# Patient Record
Sex: Female | Born: 1970 | Race: Black or African American | Hispanic: No | Marital: Single | Smoking: Current every day smoker
Health system: Southern US, Community
[De-identification: ages and names within clinical notes are randomized; demographics above are authoritative.]

## PROBLEM LIST (undated history)

## (undated) DIAGNOSIS — I10 Essential (primary) hypertension: Secondary | ICD-10-CM

## (undated) HISTORY — PX: CHOLECYSTECTOMY: SHX55

---

## 1997-07-08 ENCOUNTER — Emergency Department (HOSPITAL_COMMUNITY): Admission: EM | Admit: 1997-07-08 | Discharge: 1997-07-08 | Payer: Self-pay | Admitting: Emergency Medicine

## 1999-02-09 ENCOUNTER — Encounter: Admission: RE | Admit: 1999-02-09 | Discharge: 1999-02-09 | Payer: Self-pay | Admitting: Specialist

## 1999-02-09 ENCOUNTER — Encounter: Payer: Self-pay | Admitting: Specialist

## 1999-02-14 ENCOUNTER — Ambulatory Visit (HOSPITAL_BASED_OUTPATIENT_CLINIC_OR_DEPARTMENT_OTHER): Admission: RE | Admit: 1999-02-14 | Discharge: 1999-02-14 | Payer: Self-pay | Admitting: Specialist

## 2002-01-01 ENCOUNTER — Emergency Department (HOSPITAL_COMMUNITY): Admission: EM | Admit: 2002-01-01 | Discharge: 2002-01-01 | Payer: Self-pay | Admitting: Emergency Medicine

## 2003-12-06 ENCOUNTER — Emergency Department (HOSPITAL_COMMUNITY): Admission: EM | Admit: 2003-12-06 | Discharge: 2003-12-06 | Payer: Self-pay | Admitting: Emergency Medicine

## 2004-10-09 ENCOUNTER — Emergency Department (HOSPITAL_COMMUNITY): Admission: EM | Admit: 2004-10-09 | Discharge: 2004-10-09 | Payer: Self-pay | Admitting: Emergency Medicine

## 2008-01-17 ENCOUNTER — Emergency Department (HOSPITAL_COMMUNITY): Admission: EM | Admit: 2008-01-17 | Discharge: 2008-01-17 | Payer: Self-pay | Admitting: Emergency Medicine

## 2008-01-18 ENCOUNTER — Inpatient Hospital Stay (HOSPITAL_COMMUNITY): Admission: AD | Admit: 2008-01-18 | Discharge: 2008-01-18 | Payer: Self-pay | Admitting: Obstetrics & Gynecology

## 2008-02-12 ENCOUNTER — Encounter: Payer: Self-pay | Admitting: Obstetrics & Gynecology

## 2008-02-12 ENCOUNTER — Ambulatory Visit: Payer: Self-pay | Admitting: Obstetrics and Gynecology

## 2008-02-13 ENCOUNTER — Encounter: Payer: Self-pay | Admitting: Obstetrics and Gynecology

## 2008-02-13 LAB — CONVERTED CEMR LAB: CA 125: 6.1 units/mL (ref 0.0–30.2)

## 2008-02-17 ENCOUNTER — Telehealth (INDEPENDENT_AMBULATORY_CARE_PROVIDER_SITE_OTHER): Payer: Self-pay | Admitting: *Deleted

## 2010-01-23 ENCOUNTER — Encounter: Payer: Self-pay | Admitting: *Deleted

## 2010-04-18 LAB — URINALYSIS, ROUTINE W REFLEX MICROSCOPIC
Bilirubin Urine: NEGATIVE
Glucose, UA: NEGATIVE mg/dL
Hgb urine dipstick: NEGATIVE
Ketones, ur: NEGATIVE mg/dL
Ketones, ur: NEGATIVE mg/dL
Nitrite: NEGATIVE
Nitrite: NEGATIVE
Protein, ur: NEGATIVE mg/dL
Specific Gravity, Urine: 1.027 (ref 1.005–1.030)
Urobilinogen, UA: 1 mg/dL (ref 0.0–1.0)
pH: 7.5 (ref 5.0–8.0)
pH: 6.5 (ref 5.0–8.0)

## 2010-04-18 LAB — WET PREP, GENITAL

## 2010-04-18 LAB — POCT PREGNANCY, URINE: Preg Test, Ur: NEGATIVE

## 2010-04-18 LAB — RPR: RPR Ser Ql: NONREACTIVE

## 2010-05-17 NOTE — Group Therapy Note (Signed)
NAME:  REYANNA, BALEY NO.:  [id number]   MEDICAL RECORD NO.:  [id number]          PATIENT TYPE:  WOC   LOCATION:  [facility]                   FACILITY:  [facility]   PHYSICIAN:  Scheryl Darter, MD       DATE OF BIRTH:  1970/10/16   DATE OF SERVICE:  02/12/2008                                  CLINIC NOTE   CHIEF COMPLAINT:  Lower abdominal pain.   Patient is a 40 year old gravida 3, para 1, abortus 2, last menstrual  period January 28, 2008, who was seen in [facility]U at [facility] on  January 16 due to lower abdominal pain, which had been diagnosed as  secondary to constipation on a visit to [facility].  Several  days after her visit at [facility], she also went to [facility].  She says her pain is now resolved.  She was  told that she had a left ovarian cyst.  Her constipation has resolved.  Ultrasound has shown a 4.5 cm fundal fibroid, an 8.4 cm left complex  adnexal cyst.  She had planned to follow up with Dr. Okey Dupre in [facility], but did not do so because she lost her insurance.   PAST MEDICAL HISTORY:  Hypertension.   PAST SURGICAL HISTORY:  Appendectomy, cesarean section, cholecystectomy,  dilatation and curettage for termination of pregnancy, laparoscopy, and  supposed left oophorectomy.   SOCIAL HISTORY:  Patient is a smoker.  She denies alcohol or drug use.  She uses no birth control.   FAMILY HISTORY:  Hypertension.   MEDICATIONS:  None.   ALLERGIES:  No known drug allergies.   PHYSICAL EXAM:  Patient is in no acute distress.  Weight is 168.9 pounds, height 5 feet 4, BP 157/92, pulse 78 and  temperature 98.2.  ABDOMEN:  Nondistended, nontender.  No masses.  PELVIC EXAM:  External genitalia appeared normal.  There is slight  vaginal discharge, consistent with complaint of vaginal discharge.  Cervix is normal.  No cervical motion tenderness.  Uterus is about 8-  week size, nontender, and there is a smooth  left adnexal mass that  appears to be about 6 cm, nontender.  No right adnexal masses.   IMPRESSION:  Symptomatic left adnexal mass, which may be hemorrhagic.  She says her pain has resolved since she was seen at MAU.  We will  follow up with a repeat ultrasound in two to three weeks.  We will order  a CA125 today.      Scheryl Darter, MD     JA/MEDQ  D:  02/12/2008  T:  02/12/2008  Job:  [id number]

## 2010-05-20 NOTE — Op Note (Signed)
[place]  Patient:    Heather Cervantes, Heather Cervantes                    MRN: [id number] Proc. Date: 02/14/99 Adm. Date:  [id number] Attending:  Gustavus Messing CC:         Yaakov Guthrie. Shon Hough, M.D. (2 copies)                           Operative Report  BRIEF HISTORY:  A 40 year old lady with severe macromastia, back and shoulder pain secondary to a large pendulous breasts, as well as increased pitting.  She also has increased accessory breast tissue in right and left axillary areas impinging upon the latissimus dorsi regions causing chaffed irritation.  PROCEDURES PLANNED:  Bilateral breast reductions using the inferior pedicle technique and excision of accessory breast tissue.  SURGEON:  Yaakov Guthrie. Shon Hough, M.D.  ANESTHESIA:  General.  ASSISTANT:  ______ .  DESCRIPTION OF PROCEDURE:  The patient was set up and drawn for the inferior pedicle reduction mammoplasty, remarking the nipple areolar complexes from over  36 cm to 20.  She then underwent general anesthesia, intubated orally, and prep was done to the chest and breast areas in a routine fashion using Betadine soap and  solution and walled off with sterile towels and draped as so as to make a sterile field.  Then 0.25% Xylocaine with epinephrine 1:400,000 concentration was injected locally, at a total of 100 cc per side for vasoconstriction.  The wounds were scored with a #15 blade, and the skin over the inferior pedicle was de-epithelialized using a #20 blade.  Next, the medial and lateral fatty dermal  pedicles were excised down to underlying pectoralis major fascia.  Hemostasis was maintained with the Bovie unit or coagulation.  Out laterally, more tissue was removed to improve symmetry, as well as removal of accessory breast tissue using sharp and blunt dissection.  Hemostasis, again, was maintained with the Bovie unit or coagulation.  After proper hemostasis, the new key hole  area was debulked, and then after proper tapering, the flaps were transposed and stayed with 3-0 Prolene. Subcutaneous closure was done then with 3-0 Monocryl x 2 layers, and then a running subcuticular stitch of 3-0 Monocryl and 5-0 Monocryl throughout the inverted T.  The wounds were drained with #10 Blake drains, which were placed in the depths f the wound, and brought out through the lateral-most portion of the incision, and secured with 3-0 Prolene.  The wounds were cleansed.  Steri-Strips and a soft, bulky dressing were applied, including Xeroform, 4 x 4s, ABDs, and Hypafix. The same procedure was carried out on both sides.  The left breast was obviously larger preoperatively, and we removed over 1500 gm from that side.  We removed over 1200 gm from the right side.  She was then taken to the recovery room in good condition.  ESTIMATED BLOOD LOSS:  100 cc.  COMPLICATIONS:  None. DD:  02/14/99 TD:  02/14/99 Job: [id number]

## 2010-10-27 ENCOUNTER — Encounter: Payer: Self-pay | Admitting: *Deleted

## 2010-10-27 ENCOUNTER — Emergency Department (HOSPITAL_BASED_OUTPATIENT_CLINIC_OR_DEPARTMENT_OTHER)
Admission: EM | Admit: 2010-10-27 | Discharge: 2010-10-27 | Disposition: A | Discharge status: Home / Self Care | Payer: PRIVATE HEALTH INSURANCE | Attending: Emergency Medicine | Admitting: Emergency Medicine

## 2010-10-27 DIAGNOSIS — H9201 Otalgia, right ear: Secondary | ICD-10-CM

## 2010-10-27 DIAGNOSIS — I10 Essential (primary) hypertension: Secondary | ICD-10-CM | POA: Insufficient documentation

## 2010-10-27 DIAGNOSIS — F17200 Nicotine dependence, unspecified, uncomplicated: Secondary | ICD-10-CM | POA: Insufficient documentation

## 2010-10-27 DIAGNOSIS — H9209 Otalgia, unspecified ear: Secondary | ICD-10-CM | POA: Insufficient documentation

## 2010-10-27 HISTORY — DX: Essential (primary) hypertension: I10

## 2010-10-27 MED ORDER — IBUPROFEN 800 MG PO TABS
800.0000 mg | ORAL_TABLET | Freq: Once | ORAL | Status: AC
  Administered 2010-10-27: 800 mg via ORAL
  Filled 2010-10-27: qty 1

## 2010-10-27 NOTE — ED Notes (Signed)
Ear ache right ear

## 2010-10-27 NOTE — ED Provider Notes (Signed)
History     CSN: [id number] Arrival date & time: 10/27/2010  8:02 AM   First MD Initiated Contact with Patient 10/27/10 [phone]      Chief Complaint  Patient presents with  . Otalgia    (Consider location/radiation/quality/duration/timing/severity/associated sxs/prior treatment) HPI Patient presents with complaint of right-sided pain in her ear. She also complains of mild sore throat. She denies any nasal congestion or fevers. No cough. She has not tried anything to make the pain better. Nothing makes it worse. She denies any other associated symptoms.  Past Medical History  Diagnosis Date  . Hypertension     History reviewed. No pertinent past surgical history.  History reviewed. No pertinent family history.  History  Substance Use Topics  . Smoking status: Current Everyday Smoker -- 0.5 packs/day  . Smokeless tobacco: Never Used  . Alcohol Use: No    OB History    Grav Para Term Preterm Abortions TAB SAB Ect Mult Living                  Review of Systems ROS reviewed and otherwise negative except for mentioned in HPI  Allergies  Review of patient's allergies indicates no known allergies.  Home Medications   Current Outpatient Rx  Name Route Sig Dispense Refill  . TRIAMTERENE-HCTZ 50-25 MG PO CAPS Oral Take 1 capsule by mouth every morning.        BP 177/89  Pulse 69  Temp(Src) 98.5 F (36.9 C) (Oral)  Resp 20  Wt 170 lb (77.111 kg)  SpO2 100%  LMP 10/19/2010 Vitals reviewed, hypertensive Physical Exam Physical Examination: General appearance - alert, well appearing, and in no distress Eyes - sclera anicteric, left eye normal, right eye normal Ears - bilateral TM's and external ear canals normal Mouth - mucous membranes moist, pharynx normal without lesions, palate symmetric, uvula midline, no exudate or erythema Neck - supple, no significant adenopathy Lymphatics - no palpable lymphadenopathy Chest - clear to auscultation, no wheezes, rales or  rhonchi, symmetric air entry Heart - normal rate, regular rhythm, normal S1, S2, no murmurs, rubs, clicks or gallops Extremities - peripheral pulses normal, no pedal edema, no clubbing or cyanosis Skin - normal coloration and turgor, no rashes, no suspicious skin lesions noted  ED Course  Procedures (including critical care time)  Labs Reviewed - No data to display No results found.   1. Otalgia of right ear   2. Hypertension       MDM  Pt with right sided otalgia, no fluid collection or OM, ear exam unremarkable.  Suspect some mild eustachian tube dysfunction.  Given ibuprofen in ED for discomfort, recommended OTC decongestant coraciden (due to hx of htn).  Discharged with strict return precautions.  Pt agreeable with plan.        Ethelda Chick, MD 10/27/10 [phone]

## 2010-12-01 ENCOUNTER — Other Ambulatory Visit: Payer: Self-pay | Admitting: Internal Medicine

## 2010-12-01 DIAGNOSIS — E049 Nontoxic goiter, unspecified: Secondary | ICD-10-CM

## 2010-12-01 DIAGNOSIS — E010 Iodine-deficiency related diffuse (endemic) goiter: Secondary | ICD-10-CM

## 2010-12-05 ENCOUNTER — Ambulatory Visit
Admission: RE | Admit: 2010-12-05 | Discharge: 2010-12-05 | Disposition: A | Discharge status: Home / Self Care | Payer: PRIVATE HEALTH INSURANCE | Source: Ambulatory Visit | Attending: Internal Medicine | Admitting: Internal Medicine

## 2010-12-05 ENCOUNTER — Other Ambulatory Visit: Payer: PRIVATE HEALTH INSURANCE

## 2010-12-05 DIAGNOSIS — E049 Nontoxic goiter, unspecified: Secondary | ICD-10-CM

## 2010-12-05 DIAGNOSIS — E010 Iodine-deficiency related diffuse (endemic) goiter: Secondary | ICD-10-CM

## 2011-03-06 ENCOUNTER — Other Ambulatory Visit: Payer: Self-pay | Admitting: Endocrinology

## 2011-03-06 DIAGNOSIS — E049 Nontoxic goiter, unspecified: Secondary | ICD-10-CM

## 2011-07-13 ENCOUNTER — Encounter (HOSPITAL_COMMUNITY): Payer: Self-pay | Admitting: Emergency Medicine

## 2011-07-13 ENCOUNTER — Emergency Department (HOSPITAL_COMMUNITY)
Admission: EM | Admit: 2011-07-13 | Discharge: 2011-07-13 | Disposition: A | Discharge status: Home / Self Care | Payer: PRIVATE HEALTH INSURANCE | Attending: Emergency Medicine | Admitting: Emergency Medicine

## 2011-07-13 DIAGNOSIS — J029 Acute pharyngitis, unspecified: Secondary | ICD-10-CM | POA: Insufficient documentation

## 2011-07-13 DIAGNOSIS — I10 Essential (primary) hypertension: Secondary | ICD-10-CM | POA: Insufficient documentation

## 2011-07-13 DIAGNOSIS — F17200 Nicotine dependence, unspecified, uncomplicated: Secondary | ICD-10-CM | POA: Insufficient documentation

## 2011-07-13 DIAGNOSIS — H9209 Otalgia, unspecified ear: Secondary | ICD-10-CM | POA: Insufficient documentation

## 2011-07-13 DIAGNOSIS — J3489 Other specified disorders of nose and nasal sinuses: Secondary | ICD-10-CM | POA: Insufficient documentation

## 2011-07-13 DIAGNOSIS — J069 Acute upper respiratory infection, unspecified: Secondary | ICD-10-CM | POA: Insufficient documentation

## 2011-07-13 NOTE — ED Notes (Signed)
Patient states that she has had a sore throat and ear pain x 2-3 days. Worse today

## 2011-07-13 NOTE — ED Provider Notes (Signed)
History     CSN: [id number]  Arrival date & time 07/13/11  1858   First MD Initiated Contact with Patient 07/13/11 1950      Chief Complaint  Patient presents with  . Otalgia  . Sore Throat    (Consider location/radiation/quality/duration/timing/severity/associated sxs/prior treatment) HPI Comments: Patient presenting with sore throat, nasal congestion, and right ear pain.  Onset of symptoms was 2 days ago.  She has not taken anything for her symptoms.  Patient is a 41 y.o. female presenting with ear pain and pharyngitis. The history is provided by the patient.  Otalgia This is a new problem. The current episode started 2 days ago. There is pain in the right ear. The problem occurs constantly. The problem has not changed since onset.There has been no fever. Associated symptoms include rhinorrhea and sore throat. Pertinent negatives include no ear discharge, no headaches, no hearing loss, no abdominal pain, no vomiting, no neck pain, no cough and no rash.  Sore Throat This is a new problem. Episode onset: two days ago. The problem occurs constantly. The problem has been gradually worsening. Associated symptoms include congestion and a sore throat. Pertinent negatives include no abdominal pain, chills, coughing, fever, headaches, nausea, neck pain, rash or vomiting. The symptoms are aggravated by swallowing. She has tried nothing for the symptoms.    Past Medical History  Diagnosis Date  . Hypertension     Past Surgical History  Procedure Date  . Cesarean section   . Cholecystectomy   . Tubal ligation     History reviewed. No pertinent family history.  History  Substance Use Topics  . Smoking status: Current Everyday Smoker -- 0.5 packs/day  . Smokeless tobacco: Never Used  . Alcohol Use: No    OB History    Grav Para Term Preterm Abortions TAB SAB Ect Mult Living                  Review of Systems  Constitutional: Negative for fever and chills.  HENT: Positive for  ear pain, congestion, sore throat, rhinorrhea and postnasal drip. Negative for hearing loss, drooling, trouble swallowing, neck pain, neck stiffness, voice change, sinus pressure and ear discharge.   Respiratory: Negative for cough and shortness of breath.   Gastrointestinal: Negative for nausea, vomiting and abdominal pain.  Skin: Negative for color change and rash.  Neurological: Negative for dizziness, syncope, light-headedness and headaches.    Allergies  Review of patient's allergies indicates no known allergies.  Home Medications   Current Outpatient Rx  Name Route Sig Dispense Refill  . ATENOLOL 50 MG PO TABS Oral Take 50 mg by mouth daily.    . IBUPROFEN 200 MG PO TABS Oral Take 200 mg by mouth every 6 (six) hours as needed. Pain    . TRIAMTERENE-HCTZ 50-25 MG PO CAPS Oral Take 1 capsule by mouth every morning.       BP 138/81  Pulse 78  Temp 99.1 F (37.3 C) (Oral)  Resp 18  Wt 166 lb 4 oz (75.411 kg)  SpO2 100%  LMP 07/13/2011  Physical Exam  Nursing note and vitals reviewed. Constitutional: She appears well-developed and well-nourished.  HENT:  Head: Normocephalic and atraumatic. No trismus in the jaw.  Right Ear: Hearing, tympanic membrane, external ear and ear canal normal.  Left Ear: Hearing, tympanic membrane, external ear and ear canal normal.  Nose: Mucosal edema and rhinorrhea present. Right sinus exhibits no maxillary sinus tenderness and no frontal sinus tenderness. Left sinus  exhibits no maxillary sinus tenderness and no frontal sinus tenderness.  Mouth/Throat: Uvula is midline, oropharynx is clear and moist and mucous membranes are normal. No uvula swelling. No oropharyngeal exudate, posterior oropharyngeal edema, posterior oropharyngeal erythema or tonsillar abscesses.  Neck: Normal range of motion. Neck supple.  Cardiovascular: Normal rate, regular rhythm and normal heart sounds.   Pulmonary/Chest: Effort normal and breath sounds normal. No respiratory  distress. She has no wheezes. She has no rales.  Lymphadenopathy:    She has cervical adenopathy.  Neurological: She is alert.  Skin: Skin is warm and dry. No rash noted. No erythema.  Psychiatric: She has a normal mood and affect.    ED Course  Procedures (including critical care time)   Labs Reviewed  RAPID STREP SCREEN   No results found.   1. Viral URI       MDM  Patient presenting with sore throat, ear pain, and nasal congestion.  Rapid strep negative.  No difficulty swallowing.  Uvula midline.  Patient afebrile.  Symptoms consistent with Viral URI.          Pascal Lux [place], PA-C 07/14/11 0230

## 2011-07-16 NOTE — ED Provider Notes (Signed)
Medical screening examination/treatment/procedure(s) were performed by non-physician practitioner and as supervising physician I was immediately available for consultation/collaboration.    Lyndol Vanderheiden L Raquelle Pietro, MD 07/16/11 1030 

## 2011-08-07 ENCOUNTER — Other Ambulatory Visit: Payer: PRIVATE HEALTH INSURANCE

## 2012-04-22 ENCOUNTER — Encounter (HOSPITAL_BASED_OUTPATIENT_CLINIC_OR_DEPARTMENT_OTHER): Payer: Self-pay | Admitting: *Deleted

## 2012-04-22 ENCOUNTER — Emergency Department (HOSPITAL_BASED_OUTPATIENT_CLINIC_OR_DEPARTMENT_OTHER)
Admission: EM | Admit: 2012-04-22 | Discharge: 2012-04-23 | Disposition: A | Discharge status: Home / Self Care | Payer: Commercial Managed Care - PPO | Attending: Emergency Medicine | Admitting: Emergency Medicine

## 2012-04-22 ENCOUNTER — Emergency Department (HOSPITAL_BASED_OUTPATIENT_CLINIC_OR_DEPARTMENT_OTHER): Payer: Commercial Managed Care - PPO

## 2012-04-22 DIAGNOSIS — R059 Cough, unspecified: Secondary | ICD-10-CM | POA: Insufficient documentation

## 2012-04-22 DIAGNOSIS — R05 Cough: Secondary | ICD-10-CM | POA: Insufficient documentation

## 2012-04-22 DIAGNOSIS — I10 Essential (primary) hypertension: Secondary | ICD-10-CM | POA: Insufficient documentation

## 2012-04-22 DIAGNOSIS — R0982 Postnasal drip: Secondary | ICD-10-CM | POA: Insufficient documentation

## 2012-04-22 DIAGNOSIS — Z79899 Other long term (current) drug therapy: Secondary | ICD-10-CM | POA: Insufficient documentation

## 2012-04-22 DIAGNOSIS — F17200 Nicotine dependence, unspecified, uncomplicated: Secondary | ICD-10-CM | POA: Insufficient documentation

## 2012-04-22 NOTE — ED Notes (Signed)
Pt c/o pro cough and chest congestion x 4 days

## 2012-04-22 NOTE — ED Provider Notes (Signed)
History     CSN: [id number]  Arrival date & time 04/22/12  4098   First MD Initiated Contact with Patient 04/22/12 2304      Chief Complaint  Patient presents with  . Cough    (Consider location/radiation/quality/duration/timing/severity/associated sxs/prior treatment) HPI Comments: Heather Cervantes is a 42 y/o F with PMHx of HTN presenting to the ED with cough since Friday. Patient stated that cough is episodic, with episodes of dry and wet cough - when mucous does come up patient stated that it is a yellowish color. Patient reported that she has been using over the counter medications without relief. Stated that she was having coughing fits last night that made it difficult to sleep. Denied fever, chills, chest pain, shortness of breathe, difficulty breathing, headache, dizziness, eye tearing, eye pain, visual changes, ear pain, ottorhea, rhinorrhea, neck pain, back pain, gi symptoms, urinary symptoms, dysphagia, odynphagia, difficulty swallowing, leg swelling, palpitations, bodyaches - denied any other complaint.   Patient currently a daily smoker, smokes 0.5 ppd.   Patient works at a nursing home, positive sick contacts.   PCP: Dr. Mort Sawyers - as per patient.   The history is provided by the patient. No language interpreter was used.    Past Medical History  Diagnosis Date  . Hypertension     Past Surgical History  Procedure Laterality Date  . Cesarean section    . Cholecystectomy      History reviewed. No pertinent family history.  History  Substance Use Topics  . Smoking status: Current Every Day Smoker -- 0.50 packs/day  . Smokeless tobacco: Never Used  . Alcohol Use: No    OB History   Grav Para Term Preterm Abortions TAB SAB Ect Mult Living                  Review of Systems  Constitutional: Negative for fever and chills.  HENT: Negative for ear pain, congestion, sore throat, rhinorrhea, trouble swallowing, neck pain, neck stiffness, sinus pressure,  tinnitus and ear discharge.   Eyes: Negative for photophobia, pain, discharge, redness, itching and visual disturbance.  Respiratory: Positive for cough. Negative for chest tightness, shortness of breath and wheezing.   Cardiovascular: Negative for chest pain and palpitations.  Gastrointestinal: Negative for nausea, vomiting, abdominal pain, diarrhea and constipation.  Genitourinary: Negative for dysuria, urgency and difficulty urinating.  Musculoskeletal: Negative for myalgias, back pain and arthralgias.  Neurological: Negative for dizziness, weakness, light-headedness, numbness and headaches.  All other systems reviewed and are negative.    Allergies  Review of patient's allergies indicates no known allergies.  Home Medications   Current Outpatient Rx  Name  Route  Sig  Dispense  Refill  . atenolol (TENORMIN) 50 MG tablet   Oral   Take 50 mg by mouth daily.         . benzonatate (TESSALON) 100 MG capsule   Oral   Take 1 capsule (100 mg total) by mouth every 8 (eight) hours.   21 capsule   0   . ibuprofen (ADVIL,MOTRIN) 200 MG tablet   Oral   Take 200 mg by mouth every 6 (six) hours as needed. Pain         . triamterene-hydrochlorothiazide (DYAZIDE) 50-25 MG per capsule   Oral   Take 1 capsule by mouth every morning.            BP 170/88  Pulse 92  Temp(Src) 98.8 F (37.1 C) (Oral)  Resp 20  Ht 5'  4" (1.626 m)  Wt 179 lb (81.194 kg)  BMI 30.71 kg/m2  SpO2 100%  LMP 04/02/2012  Physical Exam  Nursing note and vitals reviewed. Constitutional: She is oriented to person, place, and time. She appears well-developed and well-nourished. No distress.  HENT:  Head: Normocephalic and atraumatic.  Nose: Nose normal.  Mouth/Throat: Oropharynx is clear and moist. No oropharyngeal exudate.  Positive post-nasal drip with thick mucus  Eyes: Conjunctivae and EOM are normal. Pupils are equal, round, and reactive to light. Right eye exhibits no discharge. Left eye  exhibits no discharge.  Neck: Normal range of motion. Neck supple. No tracheal deviation present.  Negative lymphadenopathy  Cardiovascular: Normal rate, regular rhythm and normal heart sounds.  Exam reveals no friction rub.   No murmur heard. Radial pulses 2+ bilaterally  Pulmonary/Chest: Effort normal and breath sounds normal. No respiratory distress. She has no wheezes. She has no rales. She exhibits no tenderness.  Abdominal: Soft. Bowel sounds are normal. She exhibits no distension. There is no tenderness. There is no rebound and no guarding.  Lymphadenopathy:    She has no cervical adenopathy.  Neurological: She is alert and oriented to person, place, and time. No cranial nerve deficit. She exhibits normal muscle tone. Coordination normal.  Skin: Skin is warm and dry. No rash noted. She is not diaphoretic. No erythema.  Psychiatric: She has a normal mood and affect. Her behavior is normal. Thought content normal.    ED Course  Procedures (including critical care time)  Labs Reviewed - No data to display Dg Chest 2 View  04/22/2012  *RADIOLOGY REPORT*  Clinical Data: Cough  CHEST - 2 VIEW  Comparison:  None.  Findings:  The heart size and mediastinal contours are within normal limits.  Both lungs are clear.  The visualized skeletal structures are unremarkable.  IMPRESSION: No active cardiopulmonary disease.   Original Report Authenticated By: Janeece Riggers, M.D.      1. Cough       MDM  Patient afebrile, normotensive, non-tachycardic, alert and oriented. Patient has h/o HTN, not taking ACE-Inhibitor - r/o bradykinin induced cough. Patient younger than 41 years old, no history of DVT/PE, non-tachypneic, no birth control - PERC score negative. Unremarkable physical exam - positive post-nasal drip. Chest xray negative findings. Patient aseptic, non-toxic appearing, in no acute distress. Discharged patient. 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 Discharged patient with Tessalon to aid in cough suppression. Recommended patient to follow-up with PCP to be re-evaluated. Discussed with patient to continue to rest and stay hydrated. Discussed with patient to monitor symptoms and if symptoms are to worsen or change to report back to the ED. Patient agreed to plan of care, understood, all questions noted.          Raymon Mutton, PA-C 04/23/12 [phone]

## 2012-04-23 MED ORDER — BENZONATATE 100 MG PO CAPS: 100.0000 mg | ORAL_CAPSULE | Freq: Three times a day (TID) | ORAL | Status: DC

## 2012-04-23 NOTE — ED Provider Notes (Signed)
Medical screening examination/treatment/procedure(s) were performed by non-physician practitioner and as supervising physician I was immediately available for consultation/collaboration.   Glynn Octave, MD 04/23/12 2252

## 2013-01-02 ENCOUNTER — Emergency Department (HOSPITAL_BASED_OUTPATIENT_CLINIC_OR_DEPARTMENT_OTHER)
Admission: EM | Admit: 2013-01-02 | Discharge: 2013-01-02 | Disposition: A | Discharge status: Home / Self Care | Payer: Commercial Managed Care - PPO | Attending: Emergency Medicine | Admitting: Emergency Medicine

## 2013-01-02 ENCOUNTER — Encounter (HOSPITAL_BASED_OUTPATIENT_CLINIC_OR_DEPARTMENT_OTHER): Payer: Self-pay | Admitting: Emergency Medicine

## 2013-01-02 DIAGNOSIS — Z79899 Other long term (current) drug therapy: Secondary | ICD-10-CM | POA: Insufficient documentation

## 2013-01-02 DIAGNOSIS — F17200 Nicotine dependence, unspecified, uncomplicated: Secondary | ICD-10-CM | POA: Insufficient documentation

## 2013-01-02 DIAGNOSIS — K029 Dental caries, unspecified: Secondary | ICD-10-CM | POA: Insufficient documentation

## 2013-01-02 DIAGNOSIS — I10 Essential (primary) hypertension: Secondary | ICD-10-CM | POA: Insufficient documentation

## 2013-01-02 MED ORDER — PENICILLIN V POTASSIUM 500 MG PO TABS: 500.0000 mg | ORAL_TABLET | Freq: Four times a day (QID) | ORAL | Status: AC

## 2013-01-02 MED ORDER — PENICILLIN V POTASSIUM 250 MG PO TABS
500.0000 mg | ORAL_TABLET | Freq: Once | ORAL | Status: AC
  Administered 2013-01-02: 500 mg via ORAL
  Filled 2013-01-02: qty 2

## 2013-01-02 MED ORDER — HYDROCODONE-ACETAMINOPHEN 5-325 MG PO TABS
1.0000 | ORAL_TABLET | Freq: Once | ORAL | Status: AC
Start: 2013-01-02 — End: 2013-01-02
  Administered 2013-01-02: 1 via ORAL
  Filled 2013-01-02: qty 1

## 2013-01-02 MED ORDER — HYDROCODONE-ACETAMINOPHEN 5-325 MG PO TABS: 1.0000 | ORAL_TABLET | Freq: Four times a day (QID) | ORAL | Status: DC | PRN

## 2013-01-02 NOTE — Discharge Instructions (Signed)
Dental Care and Dentist Visits Dental care supports good overall health. Regular dental visits can also help you avoid dental pain, bleeding, infection, and other more serious health problems in the future. It is important to keep the mouth healthy because diseases in the teeth, gums, and other oral tissues can spread to other areas of the body. Some problems, such as diabetes, heart disease, and pre-term labor have been associated with poor oral health.  See your dentist every 6 months. If you experience emergency problems such as a toothache or broken tooth, go to the dentist right away. If you see your dentist regularly, you may catch problems early. It is easier to be treated for problems in the early stages.  WHAT TO EXPECT AT A DENTIST VISIT  Your dentist will look for many common oral health problems and recommend proper treatment. At your regular dental visit, you can expect:  Gentle cleaning of the teeth and gums. This includes scraping and polishing. This helps to remove the sticky substance around the teeth and gums (plaque). Plaque forms in the mouth shortly after eating. Over time, plaque hardens on the teeth as tartar. If tartar is not removed regularly, it can cause problems. Cleaning also helps remove stains.  Periodic X-rays. These pictures of the teeth and supporting bone will help your dentist assess the health of your teeth.  Periodic fluoride treatments. Fluoride is a natural mineral shown to help strengthen teeth. Fluoride treatmentinvolves applying a fluoride gel or varnish to the teeth. It is most commonly done in children.  Examination of the mouth, tongue, jaws, teeth, and gums to look for any oral health problems, such as:  Cavities (dental caries). This is decay on the tooth caused by plaque, sugar, and acid in the mouth. It is best to catch a cavity when it is small.  Inflammation of the gums caused by plaque buildup (gingivitis).  Problems with the mouth or malformed  or misaligned teeth.  Oral cancer or other diseases of the soft tissues or jaws. KEEP YOUR TEETH AND GUMS HEALTHY For healthy teeth and gums, follow these general guidelines as well as your dentist's specific advice:  Have your teeth professionally cleaned at the dentist every 6 months.  Brush twice daily with a fluoride toothpaste.  Floss your teeth daily.  Ask your dentist if you need fluoride supplements, treatments, or fluoride toothpaste.  Eat a healthy diet. Reduce foods and drinks with added sugar.  Avoid smoking. TREATMENT FOR ORAL HEALTH PROBLEMS If you have oral health problems, treatment varies depending on the conditions present in your teeth and gums.  Your caregiver will most likely recommend good oral hygiene at each visit.  For cavities, gingivitis, or other oral health disease, your caregiver will perform a procedure to treat the problem. This is typically done at a separate appointment. Sometimes your caregiver will refer you to another dental specialist for specific tooth problems or for surgery. SEEK IMMEDIATE DENTAL CARE IF:  You have pain, bleeding, or soreness in the gum, tooth, jaw, or mouth area.  A permanent tooth becomes loose or separated from the gum socket.  You experience a blow or injury to the mouth or jaw area. Document Released: 08/31/2010 Document Revised: 03/13/2011 Document Reviewed: 08/31/2010 [employer] Patient Information 2014 [employer].  

## 2013-01-02 NOTE — ED Notes (Signed)
Dental pain since last night

## 2013-01-02 NOTE — ED Provider Notes (Signed)
CSN: [id number]     Arrival date & time 01/02/13  2058 History  This chart was scribed for Heather Bellmore Smitty Cords, MD by Blanchard Kelch, ED Scribe. The patient was seen in room MH09/MH09. Patient's care was started at 11:21 PM.    Chief Complaint  Patient presents with  . Dental Pain    Patient is a 43 y.o. female presenting with tooth pain. The history is provided by the patient. No language interpreter was used.  Dental Pain Location:  Upper Upper teeth location:  15/LU 2nd molar Quality:  Dull Severity:  Severe Onset quality:  Gradual Duration:  1 day Timing:  Constant Progression:  Unchanged Chronicity:  Recurrent Context: cap fell off and dental caries   Previous work-up:  Filled cavity Relieved by:  None tried Associated symptoms: no fever   Risk factors: smoking     HPI Comments: Heather Cervantes is a 43 y.o. female who presents to the Emergency Department complaining of constant top left sided dental pain that began last night. She has had prior issues with the tooth in the past. She believes the tooth was fractured but denies any pain until now. She denies taking any medication for the pain. She does not have a dentist to follow up with and is requesting medication for the infection.   Past Medical History  Diagnosis Date  . Hypertension    Past Surgical History  Procedure Laterality Date  . Cesarean section    . Cholecystectomy     No family history on file. History  Substance Use Topics  . Smoking status: Current Every Day Smoker -- 0.50 packs/day  . Smokeless tobacco: Never Used  . Alcohol Use: No   OB History   Grav Para Term Preterm Abortions TAB SAB Ect Mult Living                 Review of Systems  Constitutional: Negative for fever.  HENT: Positive for dental problem.   All other systems reviewed and are negative.    Allergies  Review of patient's allergies indicates no known allergies.  Home Medications   Current Outpatient Rx  Name   Route  Sig  Dispense  Refill  . atenolol (TENORMIN) 50 MG tablet   Oral   Take 50 mg by mouth daily.         . benzonatate (TESSALON) 100 MG capsule   Oral   Take 1 capsule (100 mg total) by mouth every 8 (eight) hours.   21 capsule   0   . ibuprofen (ADVIL,MOTRIN) 200 MG tablet   Oral   Take 200 mg by mouth every 6 (six) hours as needed. Pain         . triamterene-hydrochlorothiazide (DYAZIDE) 50-25 MG per capsule   Oral   Take 1 capsule by mouth every morning.           Triage Vitals: BP 159/96  Pulse 82  Temp(Src) 98.6 F (37 C) (Oral)  Resp 20  Ht 5\' 4"  (1.626 m)  Wt 159 lb (72.122 kg)  BMI 27.28 kg/m2  SpO2 100%  LMP 12/13/2012  Physical Exam  Nursing note and vitals reviewed. Constitutional: She is oriented to person, place, and time. She appears well-developed and well-nourished.  HENT:  Head: Normocephalic and atraumatic.  Mouth/Throat: No trismus in the jaw. No oropharyngeal exudate.  Filling in tooth top left side in the past that is currently out in molar 2 upper left with multiple other caries present  Eyes: Pupils are equal, round, and reactive to light.  Neck: Normal range of motion. Neck supple. No tracheal deviation present. No thyromegaly present.  Cardiovascular: Normal rate and regular rhythm.   Pulmonary/Chest: Effort normal and breath sounds normal. No respiratory distress. She has no wheezes. She has no rales.  Abdominal: Soft. Bowel sounds are normal. There is no tenderness.  Musculoskeletal: Normal range of motion.  Lymphadenopathy:    She has no cervical adenopathy.  Neurological: She is alert and oriented to person, place, and time.  Skin: Skin is warm and dry.  Psychiatric: She has a normal mood and affect.    ED Course  Procedures (including critical care time)  DIAGNOSTIC STUDIES: Oxygen Saturation is 100% on room air, normal by my interpretation.    COORDINATION OF CARE: 11:23 PM - Patient verbalizes understanding and  agrees with treatment plan.    Labs Review Labs Reviewed - No data to display Imaging Review No results found.  EKG Interpretation   None       MDM  No diagnosis found. Will treat with Pen vk and pain medication.  Patient reports she has dentist she can follow up with for definitive care of cavity.    I personally performed the services described in this documentation, which was scribed in my presence. The recorded information has been reviewed and is accurate.     Jasmine AweApril K Areana Kosanke-Rasch, MD 01/03/13 [phone]

## 2013-01-13 ENCOUNTER — Encounter (HOSPITAL_COMMUNITY): Payer: Self-pay | Admitting: Emergency Medicine

## 2013-01-13 ENCOUNTER — Emergency Department (INDEPENDENT_AMBULATORY_CARE_PROVIDER_SITE_OTHER)
Admission: EM | Admit: 2013-01-13 | Discharge: 2013-01-13 | Disposition: A | Discharge status: Home / Self Care | Payer: Commercial Managed Care - PPO | Source: Home / Self Care | Attending: Emergency Medicine | Admitting: Emergency Medicine

## 2013-01-13 DIAGNOSIS — T50995A Adverse effect of other drugs, medicaments and biological substances, initial encounter: Secondary | ICD-10-CM

## 2013-01-13 DIAGNOSIS — I10 Essential (primary) hypertension: Secondary | ICD-10-CM

## 2013-01-13 DIAGNOSIS — T7840XA Allergy, unspecified, initial encounter: Secondary | ICD-10-CM

## 2013-01-13 MED ORDER — PREDNISONE 20 MG PO TABS: ORAL_TABLET | ORAL | Status: DC

## 2013-01-13 MED ORDER — PREDNISONE 20 MG PO TABS
ORAL_TABLET | ORAL | Status: AC
  Filled 2013-01-13: qty 3

## 2013-01-13 MED ORDER — EPINEPHRINE 0.3 MG/0.3ML IJ SOAJ: 0.3000 mg | Freq: Once | INTRAMUSCULAR | Status: DC

## 2013-01-13 MED ORDER — DIPHENHYDRAMINE HCL 25 MG PO CAPS
ORAL_CAPSULE | ORAL | Status: AC
Start: 2013-01-13 — End: 2013-01-13
  Filled 2013-01-13: qty 2

## 2013-01-13 MED ORDER — RANITIDINE HCL 150 MG PO TABS: 150.0000 mg | ORAL_TABLET | Freq: Two times a day (BID) | ORAL | Status: DC

## 2013-01-13 MED ORDER — DIPHENHYDRAMINE HCL 25 MG PO CAPS
50.0000 mg | ORAL_CAPSULE | Freq: Once | ORAL | Status: AC
  Administered 2013-01-13: 50 mg via ORAL

## 2013-01-13 MED ORDER — PREDNISONE 20 MG PO TABS
60.0000 mg | ORAL_TABLET | Freq: Once | ORAL | Status: AC
  Administered 2013-01-13: 60 mg via ORAL

## 2013-01-13 NOTE — ED Notes (Signed)
Was asked to eval pt on arrival; pt c/o allergic reaction since yesterday PM. NAD, able to speak in complete sentences, scattered areas of itching, swelling; will have patient wait in lobby until rotation for her room assignment

## 2013-01-13 NOTE — ED Notes (Signed)
Pt  Has   Symptoms  Of  Rash  With  Swelling    Of  extremittys     With  Small  Knots     As  Well    Pt  Has  Low  Grade  Fever   Finished   A  Course  Of  Anti  Biotics  sev  Days  Ago         Pt  Sitting  Upright on  Exam table  Speaking in  Complete  sentances   And  Is  In no  Severe  Distress

## 2013-01-13 NOTE — ED Provider Notes (Signed)
  Chief Complaint    Chief Complaint  Patient presents with  . Rash    History of Present Illness      Heather Cervantes is a 43 year old female who just finished up a course of penicillin for a toothache. The day after she finished up her course of penicillin she broke out in itchy rash on her hands, her lower lip swelled up, she had itching on the arms, trunk, and generalized hives. She denies difficulty breathing or wheezing. She's had no swelling of the tongue the throat. She has taken penicillin before without any difficulty.  Review of Systems   Other than as noted above, the patient denies any of the following symptoms: Systemic:  No fever, chills, or myalgias. ENT:  No nasal congestion, rhinorrhea, sore throat, swelling of lips, tongue or throat. Resp:  No cough, wheezing, or shortness of breath.   PMFSH    Past medical history, family history, social history, meds, and allergies were reviewed.  Physical Exam     Vital signs:  BP 164/88  Pulse 98  Temp(Src) 100.8 F (38.2 C) (Oral)  Resp 18  SpO2 100%  LMP 12/13/2012 Gen:  Alert, oriented, in no distress. ENT:  Pharynx clear, no intraoral lesions, moist mucous membranes. Lungs:  Clear to auscultation. Skin:  She has swelling of her lower lip. She has hives and erythematous patches on her arms, legs, and trunk.  Course in [facility]     She was given Benadryl 50 mg by mouth and prednisone 60 mg by mouth.  Assessment    The encounter diagnosis was Allergic reaction to drug.  Allergic reaction to penicillin. She should not take penicillin or amoxicillin again unless she has been checked for a penicillin allergy by an allergist. This was noted in her chart.  Plan     1.  Meds:  The following meds were prescribed:   Discharge Medication List as of 01/13/2013  1:54 PM     She was given the following meds: Ranitidine 150 mg, #30, 1 twice a day; prednisone 20 mg, #30, 3 daily for 5 days, 2 daily for 5 days,  then 1 daily for 5 days; and an EpiPen.  2.  Patient Education/Counseling:  The patient was given appropriate handouts, self care instructions, and instructed in symptomatic relief.  3.  Follow up:  The patient was told to follow up here if no better in 3 to 4 days, or sooner if becoming worse in any way, and given some red flag symptoms such as difficulty breathing which would prompt immediate return.  Follow up here if necessary.      Reuben Likesavid C Javaughn Opdahl, MD 01/13/13 [phone]

## 2013-01-13 NOTE — Discharge Instructions (Signed)
Do not take any further penicillin antibiotics (or amoxicillin) unless checked by allergist for penicillin allergy.  Take Benadryl 50 mg every 4 hours until rash is gone.

## 2013-10-14 ENCOUNTER — Encounter (HOSPITAL_BASED_OUTPATIENT_CLINIC_OR_DEPARTMENT_OTHER): Payer: Self-pay | Admitting: Emergency Medicine

## 2013-10-14 ENCOUNTER — Emergency Department (HOSPITAL_BASED_OUTPATIENT_CLINIC_OR_DEPARTMENT_OTHER)
Admission: EM | Admit: 2013-10-14 | Discharge: 2013-10-14 | Disposition: A | Discharge status: Home / Self Care | Payer: BC Managed Care – PPO | Attending: Emergency Medicine | Admitting: Emergency Medicine

## 2013-10-14 DIAGNOSIS — J40 Bronchitis, not specified as acute or chronic: Secondary | ICD-10-CM | POA: Diagnosis not present

## 2013-10-14 DIAGNOSIS — Z720 Tobacco use: Secondary | ICD-10-CM | POA: Insufficient documentation

## 2013-10-14 DIAGNOSIS — I10 Essential (primary) hypertension: Secondary | ICD-10-CM | POA: Insufficient documentation

## 2013-10-14 DIAGNOSIS — R05 Cough: Secondary | ICD-10-CM | POA: Diagnosis present

## 2013-10-14 DIAGNOSIS — J069 Acute upper respiratory infection, unspecified: Secondary | ICD-10-CM | POA: Insufficient documentation

## 2013-10-14 DIAGNOSIS — R51 Headache: Secondary | ICD-10-CM | POA: Insufficient documentation

## 2013-10-14 DIAGNOSIS — Z880 Allergy status to penicillin: Secondary | ICD-10-CM | POA: Diagnosis not present

## 2013-10-14 MED ORDER — DM-GUAIFENESIN ER 30-600 MG PO TB12: 1.0000 | ORAL_TABLET | Freq: Two times a day (BID) | ORAL | Status: AC

## 2013-10-14 MED ORDER — ALBUTEROL SULFATE HFA 108 (90 BASE) MCG/ACT IN AERS
2.0000 | INHALATION_SPRAY | Freq: Four times a day (QID) | RESPIRATORY_TRACT | Status: DC
  Administered 2013-10-14: 2 via RESPIRATORY_TRACT
  Filled 2013-10-14: qty 6.7

## 2013-10-14 NOTE — ED Notes (Signed)
Pt reports cough, congestion, sore throat x 1.5 weeks. States she has a productive cough of a small amt of green sputum. Reports taking OTC cold meds with no relief.

## 2013-10-14 NOTE — Discharge Instructions (Signed)
Use albuterol inhaler 2 puffs every 6 hours for the next 7 days. This is to keep your lungs open and suppress the cough. Take the Mucinex DM to keep the mucus clear and to help suppress her cough. Return for any newer worse symptoms.

## 2013-10-14 NOTE — ED Notes (Signed)
Pt reports cough, URI x 1-2 weeks.  Denies fever

## 2013-10-14 NOTE — ED Provider Notes (Signed)
CSN: [id number]     Arrival date & time 10/14/13  2033 History  This chart was scribed for Vanetta MuldersScott Hasana Alcorta, MD by Bronson CurbJacqueline Melvin, ED Scribe. This patient was seen in room MH10/MH10 and the patient's care was started at 10:41 PM.    Chief Complaint  Patient presents with  . Cough     Patient is a 43 y.o. female presenting with cough. The history is provided by the patient. No language interpreter was used.  Cough Cough characteristics:  Productive Sputum characteristics:  Nondescript Severity:  Moderate Duration:  2 weeks Timing:  Intermittent Progression:  Unchanged Chronicity:  New Smoker: yes   Relieved by:  Nothing Ineffective treatments:  Cough suppressants Associated symptoms: headaches, rhinorrhea, shortness of breath and sore throat   Associated symptoms: no chest pain, no chills, no fever, no myalgias and no rash     HPI Comments: Heather Cervantes is a 43 y.o. female who presents to the Emergency Department complaining of intermittent cough onset 1-2 weeks ago. There is associated congestion, HA, and sore throat. ?She has taken Tylenol Cold and Hall's without significant improvement. Patient confirms sick contacts at home. She denies fever, chills, hemoptysis, vomiting, diarrhea, or sputum of unusual color. Patient is a current .5 PPD smoker.  PCP: Della GooHarvette Jenkins  Past Medical History  Diagnosis Date  . Hypertension    Past Surgical History  Procedure Laterality Date  . Cesarean section    . Cholecystectomy     History reviewed. No pertinent family history. History  Substance Use Topics  . Smoking status: Current Every Day Smoker -- 0.50 packs/day  . Smokeless tobacco: Never Used  . Alcohol Use: No   OB History   Grav Para Term Preterm Abortions TAB SAB Ect Mult Living                 Review of Systems  Constitutional: Negative for fever and chills.  HENT: Positive for congestion, rhinorrhea and sore throat.   Eyes: Negative for visual disturbance.   Respiratory: Positive for cough and shortness of breath.   Cardiovascular: Negative for chest pain and leg swelling.  Gastrointestinal: Negative for nausea, vomiting, abdominal pain and diarrhea.  Genitourinary: Negative for dysuria.  Musculoskeletal: Negative for myalgias.  Skin: Negative for rash.  Neurological: Positive for headaches.  Hematological: Does not bruise/bleed easily.  Psychiatric/Behavioral: Negative for confusion.      Allergies  Penicillins  Home Medications   Prior to Admission medications   Medication Sig Start Date End Date Taking? Authorizing Provider  dextromethorphan-guaiFENesin (MUCINEX DM) 30-600 MG per 12 hr tablet Take 1 tablet by mouth 2 (two) times daily. 10/14/13   Vanetta MuldersScott Jalexa Pifer, MD  triamterene-hydrochlorothiazide (DYAZIDE) 50-25 MG per capsule Take 1 capsule by mouth every morning.     Historical Provider, MD   Triage Vitals: BP 199/99  Pulse 84  Temp(Src) 99.4 F (37.4 C) (Oral)  Resp 18  Ht 5\' 4"  (1.626 m)  Wt 170 lb (77.111 kg)  BMI 29.17 kg/m2  SpO2 94%  LMP 09/30/2013  Physical Exam  Nursing note and vitals reviewed. Constitutional: She is oriented to person, place, and time. She appears well-developed and well-nourished. No distress.  HENT:  Head: Normocephalic and atraumatic.  Mouth/Throat: Mucous membranes are normal. No oropharyngeal exudate or posterior oropharyngeal erythema.  Eyes: Conjunctivae and EOM are normal.  Neck: Neck supple. No tracheal deviation present.  Cardiovascular: Normal rate, regular rhythm and normal heart sounds.   Pulmonary/Chest: Effort normal and breath sounds normal.  No respiratory distress. She has no wheezes.  Abdominal: Soft. Bowel sounds are normal. There is no tenderness.  Musculoskeletal: Normal range of motion. She exhibits no edema.  Neurological: She is alert and oriented to person, place, and time.  Skin: Skin is warm and dry.  Psychiatric: She has a normal mood and affect. Her behavior  is normal.    ED Course  Procedures (including critical care time)  DIAGNOSTIC STUDIES: Oxygen Saturation is 94% on room air, adequate by my interpretation.    COORDINATION OF CARE: 10:45 PM- Pt advised of plan for treatment and pt agrees.  Labs Review Labs Reviewed - No data to display  Imaging Review No results found.   EKG Interpretation None      MDM   Final diagnoses:  URI (upper respiratory infection)  Bronchitis    Patient with upper respiratory infection and now predominantly of bronchitis. No wheezing. Patient does have some laryngitis. Recommend Motrin for the laryngitis. Will treat with Mucinex DM and albuterol inhaler for the cough. No fever lungs clear clinically not concerned about pneumonia.  I personally performed the services described in this documentation, which was scribed in my presence. The recorded information has been reviewed and is accurate.     Vanetta MuldersScott Thurmond Hildebran, MD 10/14/13 [phone]

## 2014-07-09 ENCOUNTER — Other Ambulatory Visit: Payer: Self-pay | Admitting: Nurse Practitioner

## 2014-07-09 DIAGNOSIS — Z1231 Encounter for screening mammogram for malignant neoplasm of breast: Secondary | ICD-10-CM

## 2014-07-14 ENCOUNTER — Ambulatory Visit
Admission: RE | Admit: 2014-07-14 | Discharge: 2014-07-14 | Disposition: A | Discharge status: Home / Self Care | Payer: Commercial Managed Care - PPO | Source: Ambulatory Visit | Attending: Nurse Practitioner | Admitting: Nurse Practitioner

## 2014-07-14 DIAGNOSIS — Z1231 Encounter for screening mammogram for malignant neoplasm of breast: Secondary | ICD-10-CM

## 2015-07-13 ENCOUNTER — Other Ambulatory Visit: Payer: Self-pay | Admitting: Nurse Practitioner

## 2015-07-13 DIAGNOSIS — Z1231 Encounter for screening mammogram for malignant neoplasm of breast: Secondary | ICD-10-CM

## 2015-11-27 ENCOUNTER — Emergency Department (HOSPITAL_BASED_OUTPATIENT_CLINIC_OR_DEPARTMENT_OTHER)
Admission: EM | Admit: 2015-11-27 | Discharge: 2015-11-27 | Disposition: A | Discharge status: Home / Self Care | Payer: Commercial Managed Care - PPO | Attending: Emergency Medicine | Admitting: Emergency Medicine

## 2015-11-27 ENCOUNTER — Emergency Department (HOSPITAL_BASED_OUTPATIENT_CLINIC_OR_DEPARTMENT_OTHER): Payer: Commercial Managed Care - PPO

## 2015-11-27 ENCOUNTER — Encounter (HOSPITAL_BASED_OUTPATIENT_CLINIC_OR_DEPARTMENT_OTHER): Payer: Self-pay | Admitting: Adult Health

## 2015-11-27 DIAGNOSIS — Y929 Unspecified place or not applicable: Secondary | ICD-10-CM | POA: Diagnosis not present

## 2015-11-27 DIAGNOSIS — Z7982 Long term (current) use of aspirin: Secondary | ICD-10-CM | POA: Diagnosis not present

## 2015-11-27 DIAGNOSIS — I10 Essential (primary) hypertension: Secondary | ICD-10-CM | POA: Insufficient documentation

## 2015-11-27 DIAGNOSIS — Y939 Activity, unspecified: Secondary | ICD-10-CM | POA: Diagnosis not present

## 2015-11-27 DIAGNOSIS — Y999 Unspecified external cause status: Secondary | ICD-10-CM | POA: Insufficient documentation

## 2015-11-27 DIAGNOSIS — F17200 Nicotine dependence, unspecified, uncomplicated: Secondary | ICD-10-CM | POA: Insufficient documentation

## 2015-11-27 DIAGNOSIS — S99922A Unspecified injury of left foot, initial encounter: Secondary | ICD-10-CM | POA: Diagnosis present

## 2015-11-27 DIAGNOSIS — S92515A Nondisplaced fracture of proximal phalanx of left lesser toe(s), initial encounter for closed fracture: Secondary | ICD-10-CM

## 2015-11-27 DIAGNOSIS — X58XXXA Exposure to other specified factors, initial encounter: Secondary | ICD-10-CM | POA: Insufficient documentation

## 2015-11-27 DIAGNOSIS — Z79899 Other long term (current) drug therapy: Secondary | ICD-10-CM | POA: Insufficient documentation

## 2015-11-27 NOTE — Discharge Instructions (Signed)
Please read and follow all provided instructions.  Your diagnoses today include:  1. Closed nondisplaced fracture of proximal phalanx of lesser toe of left foot, initial encounter     Tests performed today include:  An x-ray of the affected area - shows 4th toe fracture  Vital signs. See below for your results today.   Medications prescribed:   Naproxen - anti-inflammatory pain medication  Do not exceed 500mg  naproxen every 12 hours, take with food  You have been prescribed an anti-inflammatory medication or NSAID. Take with food. Take smallest effective dose for the shortest duration needed for your pain. Stop taking if you experience stomach pain or vomiting.   Take any prescribed medications only as directed.  Home care instructions:   Follow any educational materials contained in this packet  Follow R.I.C.E. Protocol:  R - rest your injury   I  - use ice on injury without applying directly to skin  C - compress injury with bandage or splint  E - elevate the injury as much as possible  Follow-up instructions: Please follow-up with your primary care provider if you continue to have significant pain in 1 week. In this case you may have a more severe injury that requires further care.   Return instructions:   Please return if your toes or feet are numb or tingling, appear gray or blue, or you have severe pain (also elevate the leg and loosen splint or wrap if you were given one)  Please return to the Emergency Department if you experience worsening symptoms.   Please return if you have any other emergent concerns.  Additional Information:  Your vital signs today were: BP 152/91 (BP Location: Left Arm)    Pulse 75    Temp 97.9 F (36.6 C) (Oral)    Resp 20    Ht 5\' 4"  (1.626 m)    Wt 84.8 kg    LMP 09/30/2013    SpO2 100%    BMI 32.10 kg/m  If your blood pressure (BP) was elevated above 135/85 this visit, please have this repeated by your doctor within one  month. --------------

## 2015-11-27 NOTE — ED Triage Notes (Signed)
Presents with left foot pain on plantar surface of foot at base of 3rd and 4th toe. Pain is worse when flexion of toes, began a week ago assocaited with swelling, denies injury. Pain is getting better but swelling isnot.

## 2015-11-27 NOTE — ED Provider Notes (Signed)
[facility] Provider Note   CSN: [id number] Arrival date & time: 11/27/15  1930  By signing my name below, I, Emmanuella Mensah, attest that this documentation has been prepared under the direction and in the presence of Renne CriglerJoshua Anaja Monts, PA-C. Electronically Signed: Angelene GiovanniEmmanuella Mensah, ED Scribe. 11/27/15. 9:10 PM.   History   Chief Complaint Chief Complaint  Patient presents with  . Foot Pain    HPI Comments: Heather Cervantes is a 45 y.o. female with a hx of hypertension who presents to the Emergency Department complaining of gradually worsening moderate pain to her left foot with swelling (mostly concentrated around the 4th toe) onset one week ago. She notes that the pain is worse with ROM of her toes. She denies any known falls, injuries, or trauma to the foot. No alleviating factors noted. Pt has not tried any medications PTA. She has an allergy to Penicillins. She denies any fever, chills, vomiting, open wounds, or any other symptoms.   The history is provided by the patient. No language interpreter was used.    Past Medical History:  Diagnosis Date  . Hypertension     There are no active problems to display for this patient.   Past Surgical History:  Procedure Laterality Date  . CESAREAN SECTION    . CHOLECYSTECTOMY      OB History    No data available       Home Medications    Prior to Admission medications   Medication Sig Start Date End Date Taking? Authorizing Provider  amLODipine (NORVASC) 5 MG tablet Take 5 mg by mouth daily.   Yes Historical Provider, MD  aspirin 81 MG chewable tablet Chew 81 mg by mouth daily.   Yes Historical Provider, MD  losartan-hydrochlorothiazide (HYZAAR) 100-12.5 MG tablet Take 1 tablet by mouth daily.   Yes Historical Provider, MD  dextromethorphan-guaiFENesin (MUCINEX DM) 30-600 MG per 12 hr tablet Take 1 tablet by mouth 2 (two) times daily. 10/14/13   Vanetta MuldersScott Zackowski, MD  triamterene-hydrochlorothiazide (DYAZIDE)  50-25 MG per capsule Take 1 capsule by mouth every morning.     Historical Provider, MD    Family History History reviewed. No pertinent family history.  Social History Social History  Substance Use Topics  . Smoking status: Current Every Day Smoker    Packs/day: 0.50  . Smokeless tobacco: Never Used  . Alcohol use No     Allergies   Penicillins   Review of Systems Review of Systems  Constitutional: Negative for chills and fever.  Gastrointestinal: Negative for vomiting.  Musculoskeletal: Positive for arthralgias and joint swelling.  Skin: Negative for wound.     Physical Exam Updated Vital Signs BP 152/91 (BP Location: Left Arm)   Pulse 75   Temp 97.9 F (36.6 C) (Oral)   Resp 20   Ht 5\' 4"  (1.626 m)   Wt 187 lb (84.8 kg)   LMP 09/30/2013   SpO2 100%   BMI 32.10 kg/m   Physical Exam  Constitutional: She appears well-developed and well-nourished.  HENT:  Head: Normocephalic and atraumatic.  Eyes: Pupils are equal, round, and reactive to light.  Neck: Normal range of motion. Neck supple.  Cardiovascular: Normal rate and normal pulses.  Exam reveals no decreased pulses.   Pulmonary/Chest: Effort normal.  Musculoskeletal: She exhibits tenderness. She exhibits no edema.       Feet:  Neurological: She is alert. No sensory deficit.  Motor, sensation, and vascular distal to the injury is fully intact.  Skin: Skin is warm and dry.  Psychiatric: She has a normal mood and affect.  Nursing note and vitals reviewed.    ED Treatments / Results  DIAGNOSTIC STUDIES: Oxygen Saturation is 100% on RA, normal by my interpretation.    COORDINATION OF CARE: 9:09 PM- Pt advised of plan for treatment and pt agrees. Pt informed of her left foot x-ray results. Will buddy tape her toes. Advised pt to follow up with PCP.   Patient was counseled on RICE protocol and told to rest injury, use ice for no longer than 15 minutes every hour, compress the area, and elevate above  the level of their heart as much as possible to reduce swelling. Questions answered. Patient verbalized understanding.    Radiology Dg Foot Complete Left  Result Date: 11/27/2015 CLINICAL DATA:  Plantar forefoot pain for 1 week, no injury. EXAM: LEFT FOOT - COMPLETE 3+ VIEW COMPARISON:  None. FINDINGS: Acute minimally displaced base of fourth proximal phalanx intra-articular corner fracture. No dislocation. No destructive bony lesions. Soft tissue swelling without subcutaneous gas or radiopaque foreign bodies. IMPRESSION: Acute minimally displaced base of fourth proximal phalanx fracture. Electronically Signed   By: Awilda Metroourtnay  Bloomer M.D.   On: 11/27/2015 20:18    Procedures Procedures (including critical care time)  Medications Ordered in ED Medications - No data to display   Initial Impression / Assessment and Plan / ED Course  Renne CriglerJoshua Aalia Greulich, PA-C has reviewed the triage vital signs and the nursing notes.  Pertinent labs & imaging results that were available during my care of the patient were reviewed by me and considered in my medical decision making (see chart for details).  Clinical Course    Vital signs reviewed and are as follows: Vitals:   11/27/15 1935 11/27/15 2120  BP: 152/91 152/79  Pulse: 75 72  Resp: 20 16  Temp: 97.9 F (36.6 C)     Final Clinical Impressions(s) / ED Diagnoses   Final diagnoses:  Closed nondisplaced fracture of proximal phalanx of lesser toe of left foot, initial encounter   Patient with foot pain, x-ray demonstrates toe fracture. Conservative measures indicated.  New Prescriptions Discharge Medication List as of 11/27/2015  9:20 PM     I personally performed the services described in this documentation, which was scribed in my presence. The recorded information has been reviewed and is accurate.    Renne CriglerJoshua Shean Gerding, PA-C 11/27/15 [id number]    Arby BarretteMarcy Pfeiffer, MD 11/28/15 [phone]

## 2016-02-26 IMAGING — MG MM DIGITAL SCREENING BILATERAL
4 series · 4 of 4 positions shown · non-contrast
Comparison: None.

CLINICAL DATA: Screening. History of bilateral breast reduction

EXAM:
DIGITAL SCREENING BILATERAL MAMMOGRAM WITH CAD

[R CC]
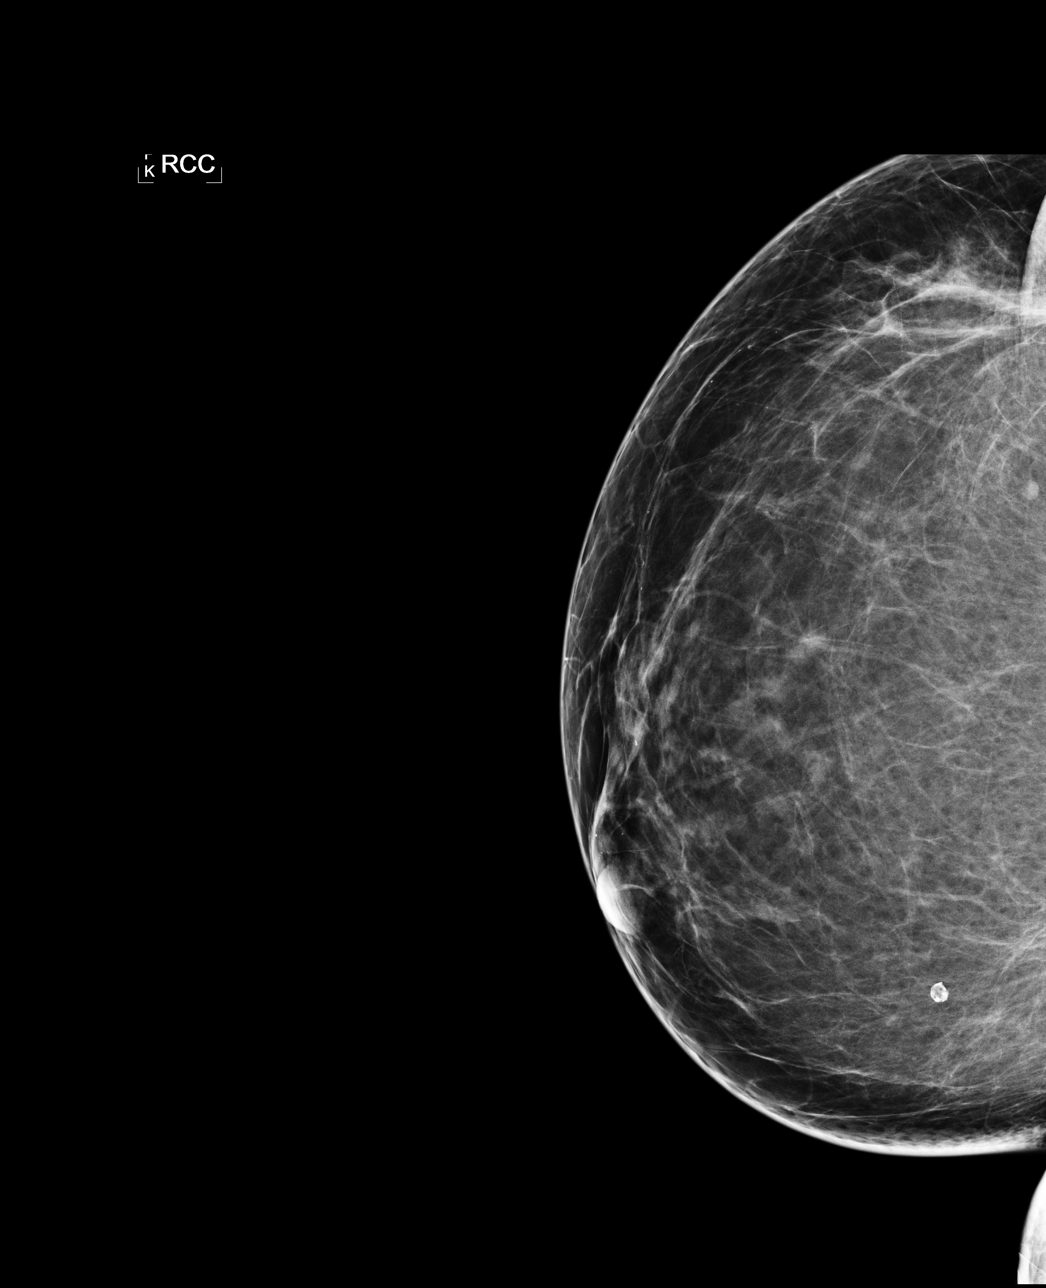

[L CC]
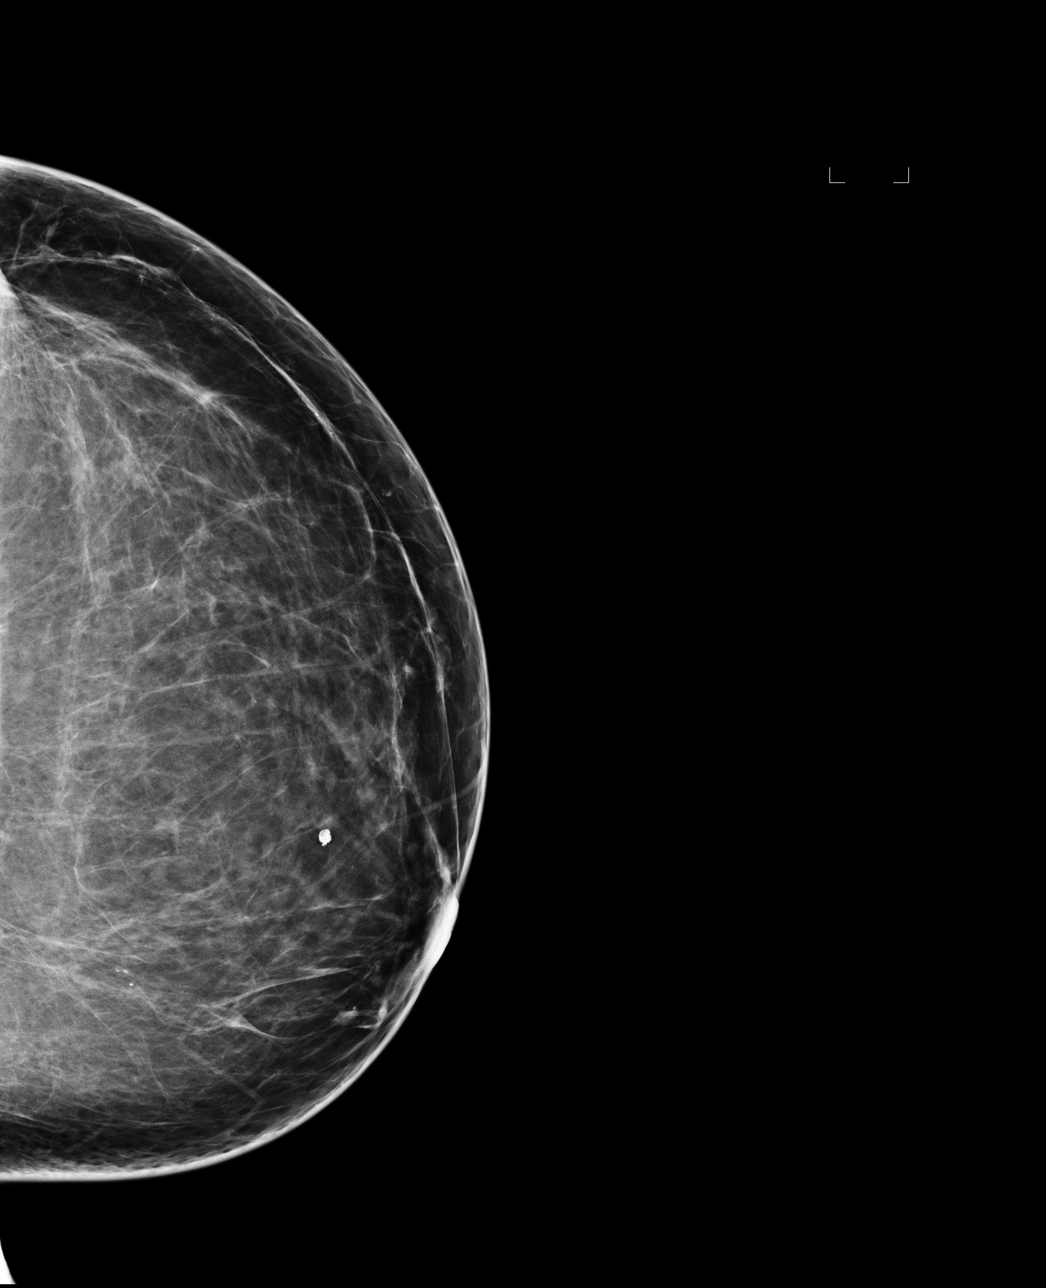

[L MLO]
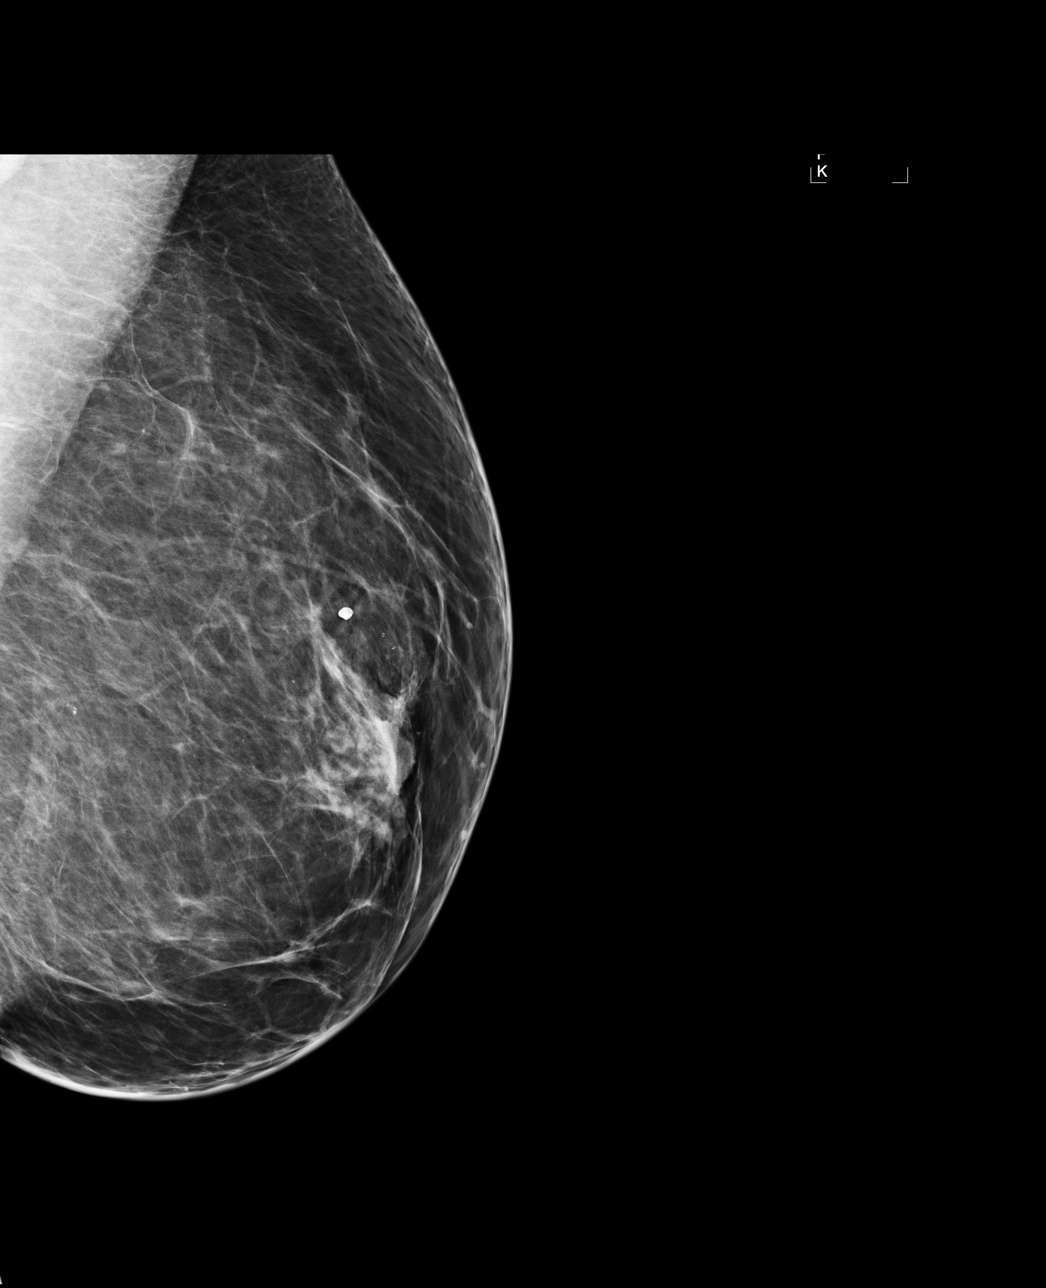

[R MLO]
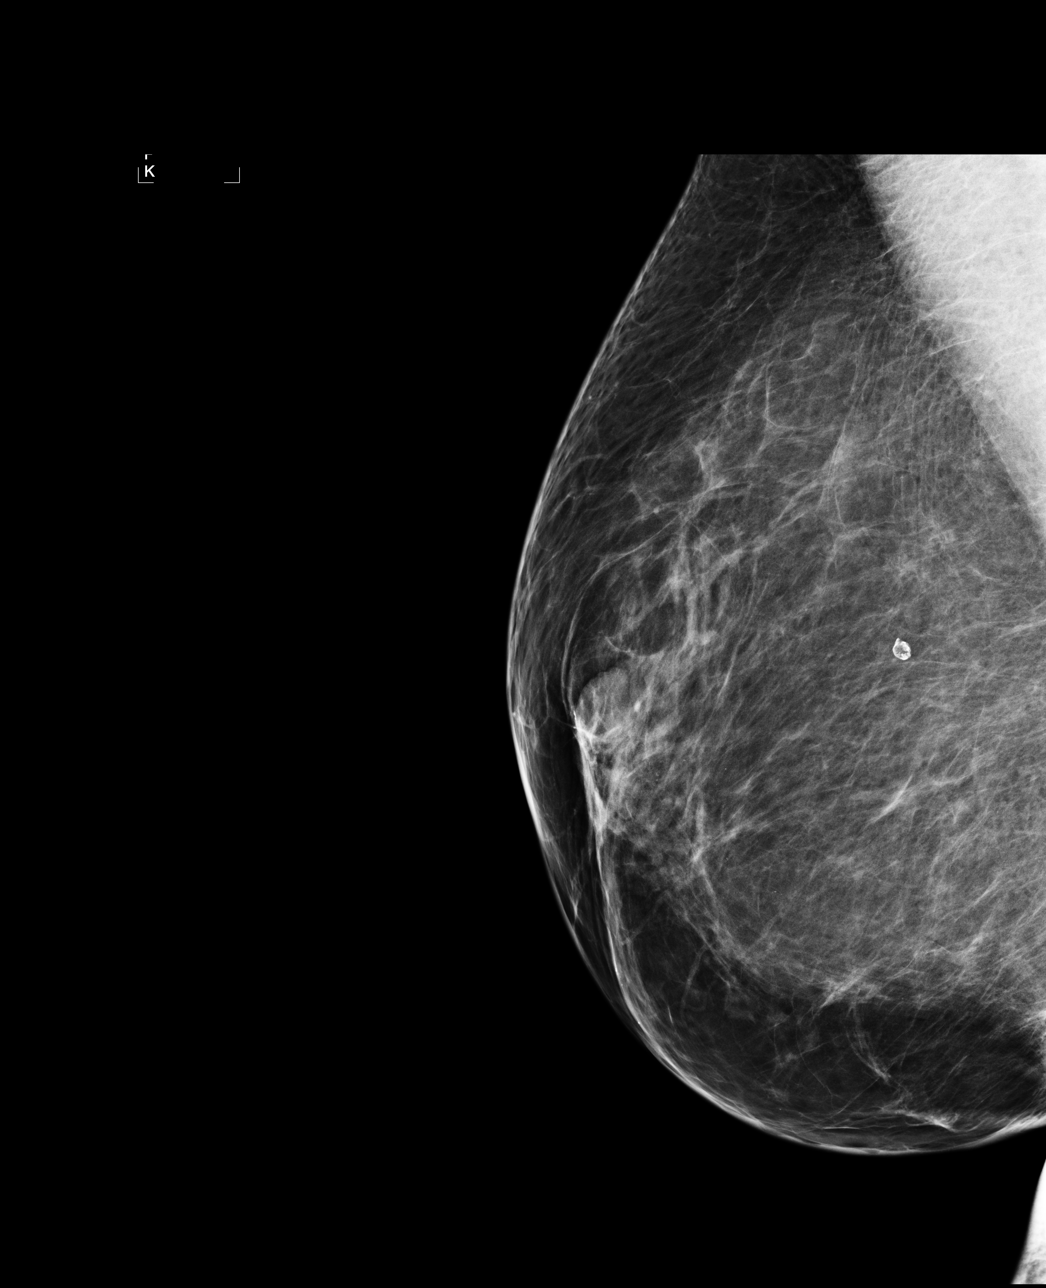

[4 of 4 positions shown; findings below may reference images not displayed]

ACR Breast Density Category b: There are scattered areas of
fibroglandular density.
FINDINGS: There are postsurgical changes bilaterally related to the given
history of breast reduction. There are no dominant masses,
suspicious calcifications or secondary signs of malignancy
identified in either breast. Images were processed with CAD.
IMPRESSION: No mammographic evidence of malignancy. A result letter of this
screening mammogram will be mailed directly to the patient.

RECOMMENDATION:
Screening mammogram in one year. (Code:TQ-P-IRM)

BI-RADS CATEGORY  2: Benign.

## 2017-07-11 IMAGING — CR DG FOOT COMPLETE 3+V*L*
3 series · 3 of 3 positions shown · non-contrast
Comparison: None.

CLINICAL DATA: Plantar forefoot pain for 1 week, no injury.

EXAM:
LEFT FOOT - COMPLETE 3+ VIEW

[t foot ap left]
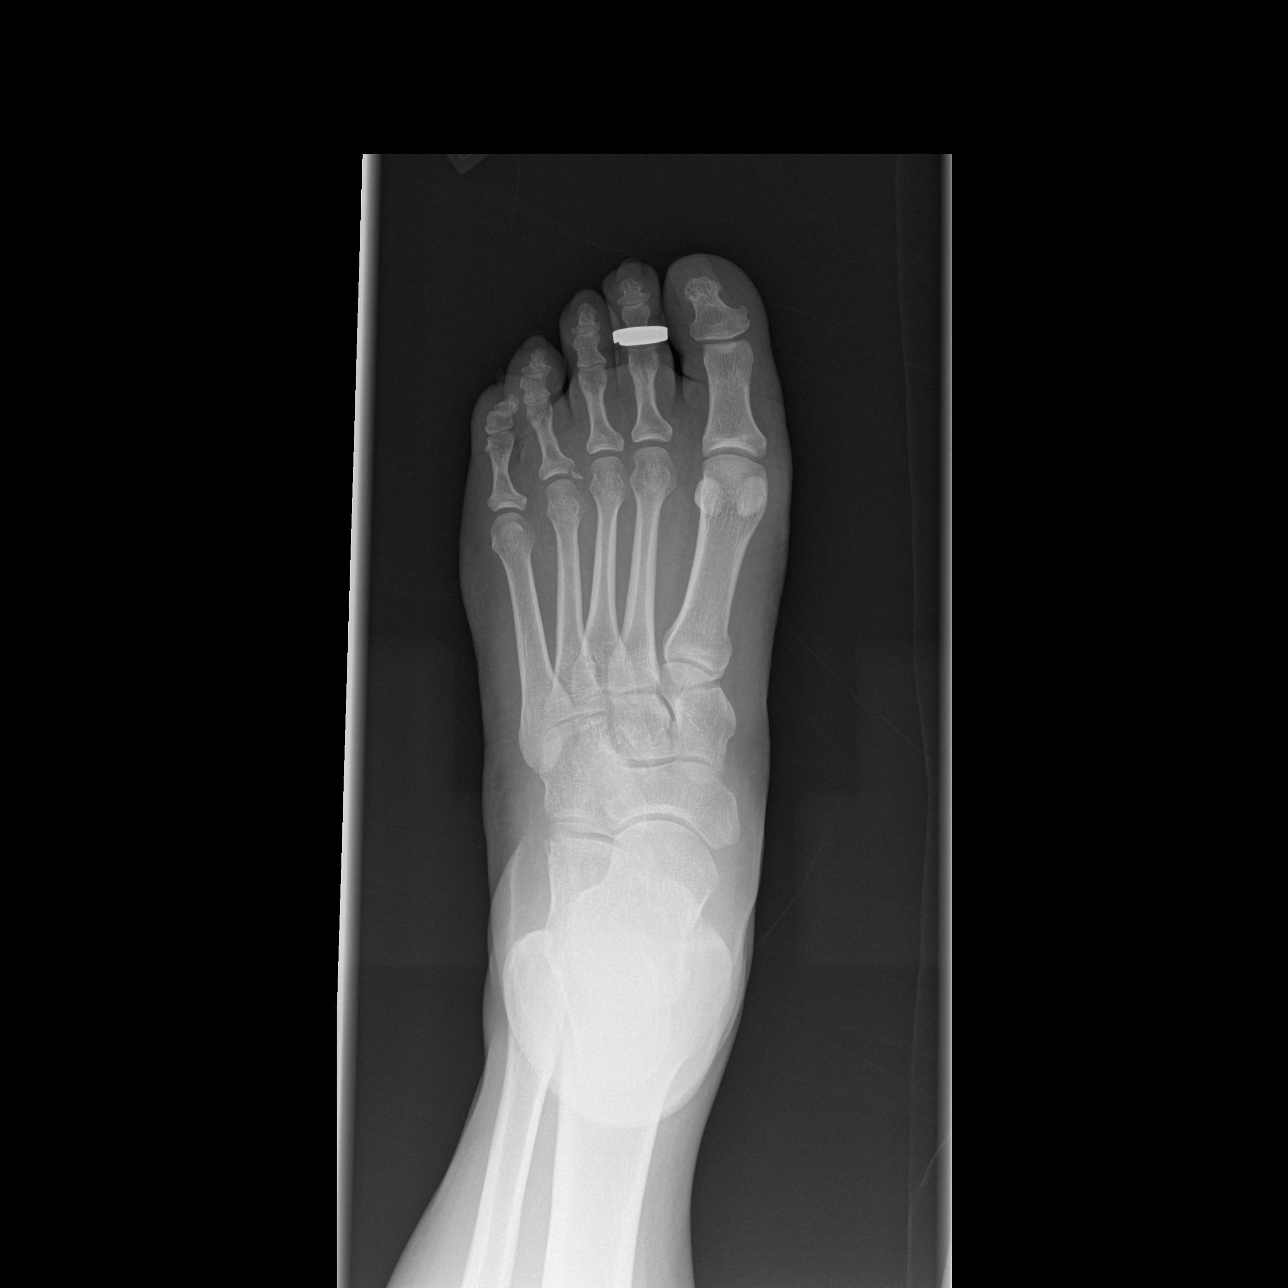

[t foot oblique left]
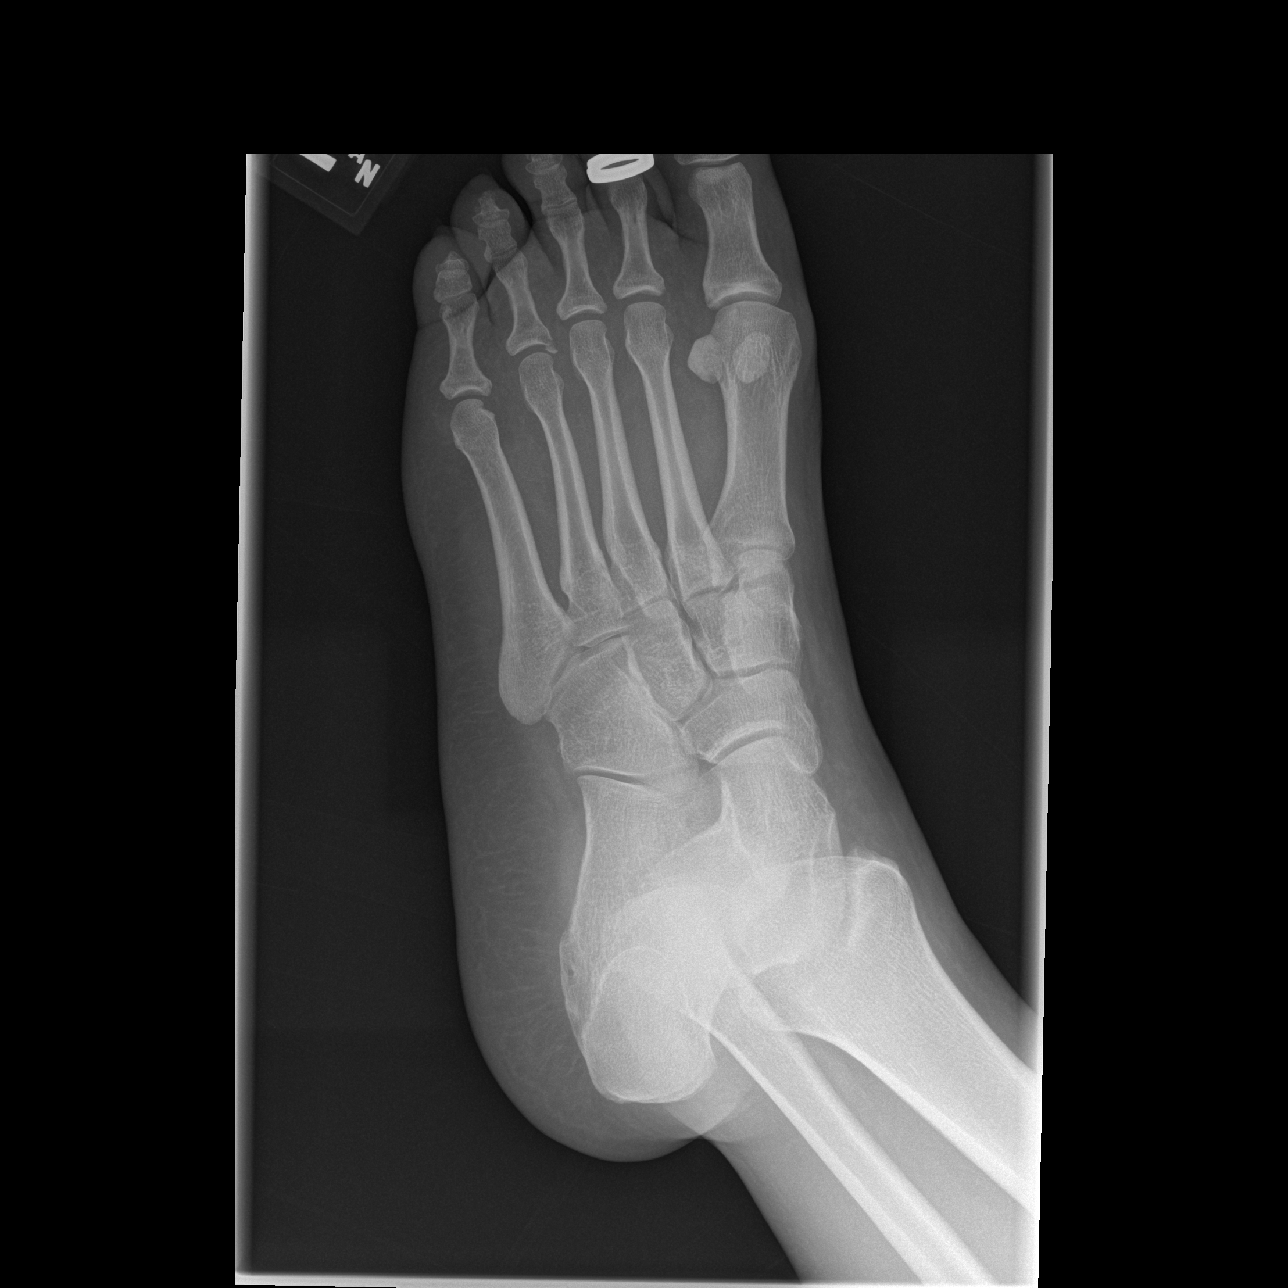

[t foot lat left]
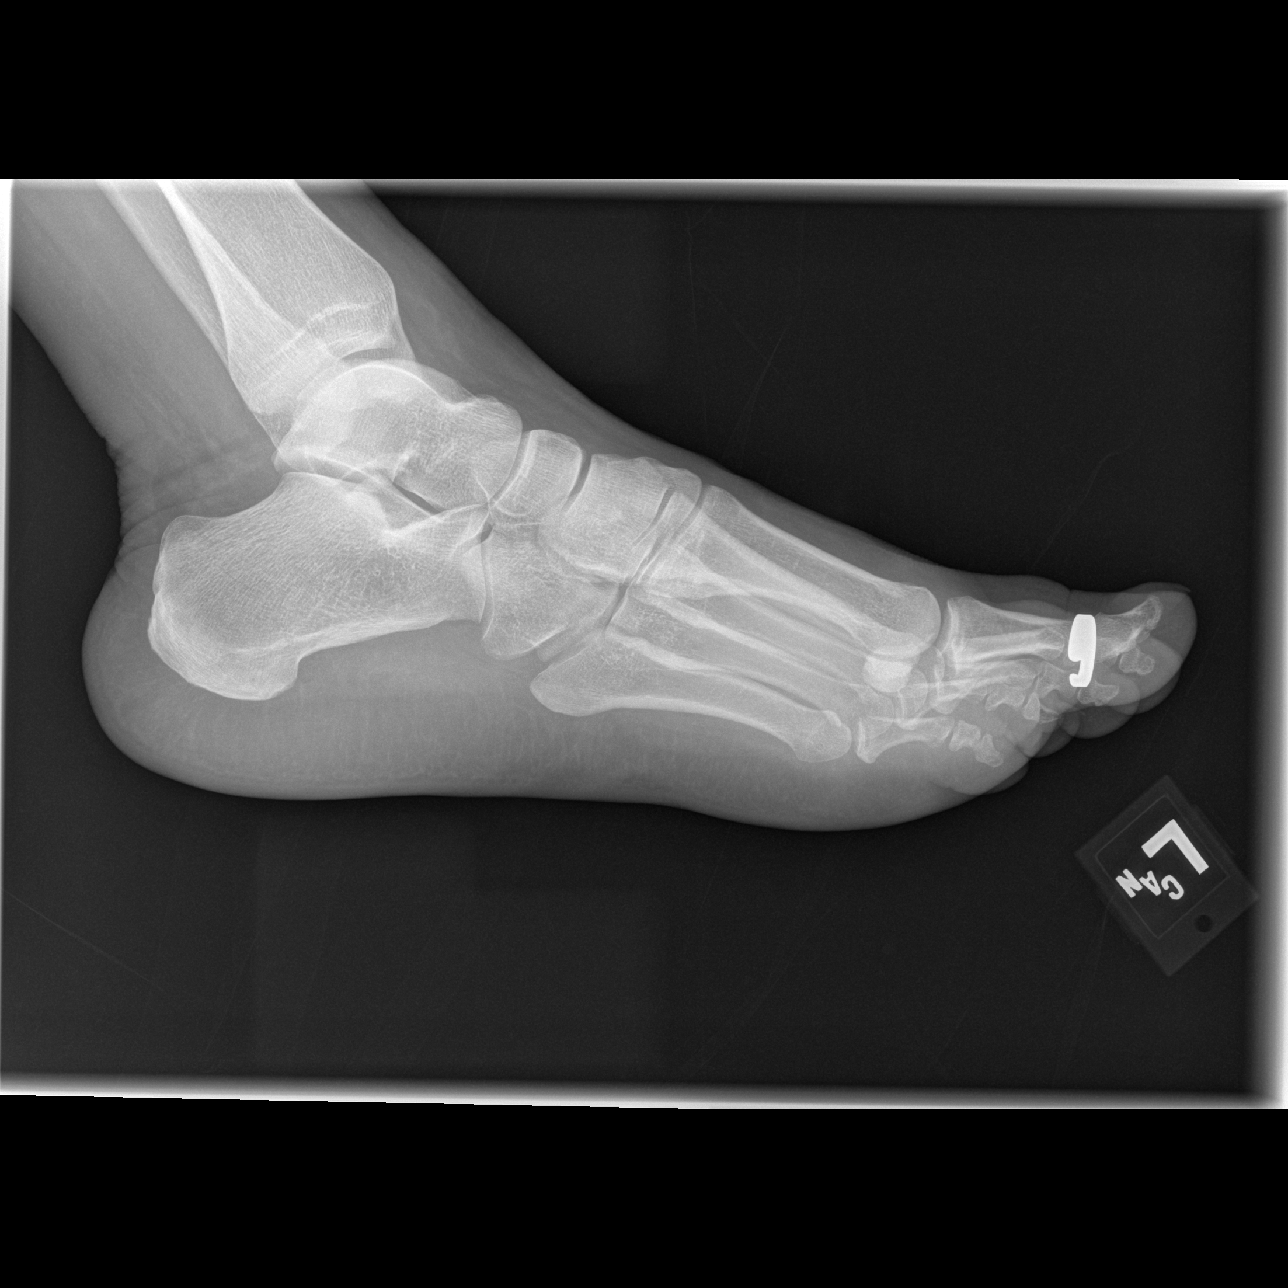

[3 of 3 positions shown; findings below may reference images not displayed]

FINDINGS: Acute minimally displaced base of fourth proximal phalanx
intra-articular corner fracture. No dislocation. No destructive bony
lesions. Soft tissue swelling without subcutaneous gas or radiopaque
foreign bodies.
IMPRESSION: Acute minimally displaced base of fourth proximal phalanx fracture.

## 2020-10-18 ENCOUNTER — Other Ambulatory Visit (HOSPITAL_BASED_OUTPATIENT_CLINIC_OR_DEPARTMENT_OTHER): Payer: Self-pay

## 2020-10-18 MED ORDER — METOPROLOL SUCCINATE ER 50 MG PO TB24
50.0000 mg | ORAL_TABLET | Freq: Every day | ORAL | 1 refills | Status: AC
  Filled 2020-10-18: qty 30, 30d supply, fill #0

## 2020-10-18 MED ORDER — METOPROLOL TARTRATE 50 MG PO TABS
50.0000 mg | ORAL_TABLET | Freq: Every day | ORAL | 1 refills | Status: DC
  Filled 2020-10-18: qty 30, 30d supply, fill #0

## 2020-10-26 ENCOUNTER — Other Ambulatory Visit (HOSPITAL_BASED_OUTPATIENT_CLINIC_OR_DEPARTMENT_OTHER): Payer: Self-pay

## 2021-07-19 ENCOUNTER — Other Ambulatory Visit: Payer: Self-pay

## 2021-07-21 ENCOUNTER — Other Ambulatory Visit (HOSPITAL_BASED_OUTPATIENT_CLINIC_OR_DEPARTMENT_OTHER): Payer: Self-pay

## 2021-07-21 MED ORDER — METOPROLOL SUCCINATE ER 50 MG PO TB24
50.0000 mg | ORAL_TABLET | Freq: Every day | ORAL | 1 refills | Status: AC
  Filled 2021-07-21: qty 30, 30d supply, fill #0

## 2021-08-01 ENCOUNTER — Other Ambulatory Visit (HOSPITAL_BASED_OUTPATIENT_CLINIC_OR_DEPARTMENT_OTHER): Payer: Self-pay

## 2021-11-25 ENCOUNTER — Other Ambulatory Visit (HOSPITAL_BASED_OUTPATIENT_CLINIC_OR_DEPARTMENT_OTHER): Payer: Self-pay

## 2021-11-25 MED ORDER — METOPROLOL SUCCINATE ER 50 MG PO TB24
50.0000 mg | ORAL_TABLET | Freq: Every day | ORAL | 1 refills | Status: AC
  Filled 2021-11-25: qty 30, 30d supply, fill #0

## 2021-12-05 ENCOUNTER — Other Ambulatory Visit (HOSPITAL_BASED_OUTPATIENT_CLINIC_OR_DEPARTMENT_OTHER): Payer: Self-pay
